# Patient Record
Sex: Female | Born: 1950 | Race: White | Hispanic: No | Marital: Married | Smoking: Former smoker
Health system: Southern US, Community
[De-identification: ages and names within clinical notes are randomized; demographics above are authoritative.]

## PROBLEM LIST (undated history)

## (undated) DIAGNOSIS — I10 Essential (primary) hypertension: Secondary | ICD-10-CM

## (undated) DIAGNOSIS — Z789 Other specified health status: Secondary | ICD-10-CM

## (undated) HISTORY — PX: ABDOMINAL HYSTERECTOMY: SUR658

---

## 2004-04-04 ENCOUNTER — Ambulatory Visit: Payer: Self-pay | Admitting: Gastroenterology

## 2008-08-09 ENCOUNTER — Ambulatory Visit: Payer: Self-pay | Admitting: Obstetrics and Gynecology

## 2009-06-12 ENCOUNTER — Ambulatory Visit: Payer: Self-pay | Admitting: Gastroenterology

## 2013-10-18 ENCOUNTER — Ambulatory Visit: Payer: Self-pay | Admitting: Urology

## 2013-10-18 LAB — CREATININE, SERUM: Creatinine: 0.76 mg/dL (ref 0.60–1.30)

## 2015-01-11 ENCOUNTER — Telehealth: Payer: Self-pay | Admitting: *Deleted

## 2015-01-11 ENCOUNTER — Telehealth: Payer: Self-pay

## 2015-01-11 NOTE — Telephone Encounter (Signed)
[employer] called for prior authorization for [facility] [phone] (REF# [id number])

## 2015-07-16 NOTE — Telephone Encounter (Signed)
Encounter created in error

## 2015-12-06 ENCOUNTER — Ambulatory Visit
Admission: EM | Admit: 2015-12-06 | Discharge: 2015-12-06 | Disposition: A | Discharge status: Home / Self Care | Payer: Medicare Other | Attending: Family Medicine | Admitting: Family Medicine

## 2015-12-06 ENCOUNTER — Ambulatory Visit: Payer: Medicare Other

## 2015-12-06 DIAGNOSIS — Z87891 Personal history of nicotine dependence: Secondary | ICD-10-CM | POA: Diagnosis not present

## 2015-12-06 DIAGNOSIS — M25551 Pain in right hip: Secondary | ICD-10-CM | POA: Diagnosis not present

## 2015-12-06 DIAGNOSIS — M1611 Unilateral primary osteoarthritis, right hip: Secondary | ICD-10-CM | POA: Insufficient documentation

## 2015-12-06 DIAGNOSIS — M5431 Sciatica, right side: Secondary | ICD-10-CM | POA: Insufficient documentation

## 2015-12-06 HISTORY — DX: Other specified health status: Z78.9

## 2015-12-06 MED ORDER — ORPHENADRINE CITRATE ER 100 MG PO TB12: 100.0000 mg | ORAL_TABLET | Freq: Two times a day (BID) | ORAL | Status: DC

## 2015-12-06 MED ORDER — PREDNISONE 10 MG (21) PO TBPK: ORAL_TABLET | ORAL | Status: DC

## 2015-12-06 MED ORDER — MELOXICAM 15 MG PO TABS: 15.0000 mg | ORAL_TABLET | Freq: Every day | ORAL | Status: DC

## 2015-12-06 NOTE — ED Provider Notes (Signed)
CSN: [id number]     Arrival date & time 12/06/15  1209 History   First MD Initiated Contact with Patient 12/06/15 1246    Nurses notes were reviewed. Chief Complaint  Patient presents with  . Sciatica    Pt with right buttock/hip/leg pain x 8 weeks. Diagnosed by chiropractor with sciatica and receiving treatments 3x/weekly. Wants something for the pain stronger than Tylenol or Ibuprofen. Pain 2/10 currently but sometimes gets as high as 8/10    Patient reports started having back pain about 3-4 months ago while she was on a cruise and she was doing some extra walking. For the last 8 weeks is gotten worse and so last week she came to a chiropractor but she had 3 treatments last week and 2 treatments this week. Cousin mouth pain and discomfort she was sent to the urgent care for second evaluation and x-rays. She reports no history of previous back pain she does have a history of having some arthritis in her hip. She states that she's taken some over-the-counter Aleve but it has not helped the pain. She denies any nausea no direct trauma to the hip. She states the pain comes and goes some days is worse than others the last few nights she's not been able to rest getting sleep well.  She states is pretty healthy. Longevity of life with the mother and grandmother. No pertinent family medical history relating to today's visit. No known drug allergies and she stopped smoking over 17 years ago. Only surgeries of his hysterectomy.     (Consider location/radiation/quality/duration/timing/severity/associated sxs/prior Treatment) Patient is a 65 y.o. female presenting with back pain. The history is provided by the patient. No language interpreter was used.  Back Pain Location:  Sacro-iliac joint Quality:  Aching and stabbing Radiates to:  R posterior upper leg and R thigh Pain severity:  Moderate Timing:  Constant Progression:  Resolved Relieved by:  Nothing Worsened by:  NSAIDs Associated symptoms:  leg pain and numbness     Past Medical History  Diagnosis Date  . Patient denies medical problems    Past Surgical History  Procedure Laterality Date  . Abdominal hysterectomy     History reviewed. No pertinent family history. Social History  Substance Use Topics  . Smoking status: Former Games developer  . Smokeless tobacco: None  . Alcohol Use: Yes     Comment: few times per week   OB History    No data available     Review of Systems  Musculoskeletal: Positive for back pain.  Neurological: Positive for numbness.    Allergies  Review of patient's allergies indicates no known allergies.  Home Medications   Prior to Admission medications   Medication Sig Start Date End Date Taking? Authorizing Provider  meloxicam (MOBIC) 15 MG tablet Take 1 tablet (15 mg total) by mouth daily. 12/06/15   Hassan Rowan, MD  orphenadrine (NORFLEX) 100 MG tablet Take 1 tablet (100 mg total) by mouth 2 (two) times daily. 12/06/15   Hassan Rowan, MD  predniSONE (STERAPRED UNI-PAK 21 TAB) 10 MG (21) TBPK tablet Sig 6 tablet day 1, 5 tablets day 2, 4 tablets day 3,,3tablets day 4, 2 tablets day 5, 1 tablet day 6 take all tablets orally 12/06/15   Hassan Rowan, MD   Meds Ordered and Administered this Visit  Medications - No data to display  BP 194/88 mmHg  Pulse 67  Temp(Src) 98.1 F (36.7 C) (Oral)  Resp 16  Ht 5' 3.75" (1.619 m)  Wt 163 lb (73.936 kg)  BMI 28.21 kg/m2  SpO2 98% No data found.   Physical Exam  Constitutional: She is oriented to person, place, and time. She appears well-developed and well-nourished.  HENT:  Head: Normocephalic and atraumatic.  Eyes: Pupils are equal, round, and reactive to light.  Neck: Normal range of motion. Neck supple.  Musculoskeletal: Normal range of motion. She exhibits tenderness.       Right shoulder: She exhibits normal range of motion.       Lumbar back: She exhibits tenderness, pain and spasm. She exhibits normal range of motion and no swelling.        Back:  Patient with pain over the right iliac sacral area and over the right hip but not that much tenderness over the lumbar spine. Patient is states the pain is about a 6/10  Neurological: She is alert and oriented to person, place, and time.  Skin: Skin is warm and dry.  Psychiatric: She has a normal mood and affect.  Vitals reviewed.   ED Course  Procedures (including critical care time)  Labs Review Labs Reviewed - No data to display  Imaging Review Dg Si Joints  12/06/2015  CLINICAL DATA:  Right hip and low back pain for 2 months, no injury EXAM: BILATERAL SACROILIAC JOINTS - 3+ VIEW COMPARISON:  CT abdomen pelvis of 10/18/2013 FINDINGS: The SI joints are well corticated. There is no evidence of sacroiliitis. The both hips are normal position. The pelvic rami are intact. The sacral foramina appear well corticated. IMPRESSION: Negative. Electronically Signed   By: Dwyane DeePaul  Barry M.D.   On: 12/06/2015 14:12   Dg Hip Unilat With Pelvis 2-3 Views Right  12/06/2015  CLINICAL DATA:  Right hip and low back pain for 2 months, no injury EXAM: DG HIP (WITH OR WITHOUT PELVIS) 2-3V RIGHT COMPARISON:  The hips are normal position. Very minimal degenerative joint disease of the hips is present with minimal bony spurring. The pelvic rami are intact. The SI joints are corticated. No acute abnormality is seen. IMPRESSION: Very minimal degenerative joint disease of the hips for age. No acute abnormality. Electronically Signed   By: Dwyane DeePaul  Barry M.D.   On: 12/06/2015 14:14     Visual Acuity Review  Right Eye Distance:   Left Eye Distance:   Bilateral Distance:    Right Eye Near:   Left Eye Near:    Bilateral Near:         MDM   1. Back pain with right-sided sciatica   2. Hip pain, acute, right   3. Osteoarthritis of right hip, unspecified osteoarthritis type    Patient informed results for x-rays will place on prednisone, Mobic 15 mg and Norflex 100 mg twice a day. Follow-up with Dr. Duwayne HeckIsaiah  next week. .  Note: This dictation was prepared with Dragon dictation along with smaller phrase technology. Any transcriptional errors that result from this process are unintentional.    Hassan RowanEugene Quiara Killian, MD 12/06/15 1445

## 2015-12-06 NOTE — Discharge Instructions (Signed)
Cryotherapy Cryotherapy is when you put ice on your injury. Ice helps lessen pain and puffiness (swelling) after an injury. Ice works the best when you start using it in the first 24 to 48 hours after an injury. HOME CARE  Put a dry or damp towel between the ice pack and your skin.  You may press gently on the ice pack.  Leave the ice on for no more than 10 to 20 minutes at a time.  Check your skin after 5 minutes to make sure your skin is okay.  Rest at least 20 minutes between ice pack uses.  Stop using ice when your skin loses feeling (numbness).  Do not use ice on someone who cannot tell you when it hurts. This includes small children and people with memory problems (dementia). GET HELP RIGHT AWAY IF:  You have white spots on your skin.  Your skin turns blue or pale.  Your skin feels waxy or hard.  Your puffiness gets worse. MAKE SURE YOU:   Understand these instructions.  Will watch your condition.  Will get help right away if you are not doing well or get worse.   This information is not intended to replace advice given to you by your health care provider. Make sure you discuss any questions you have with your health care provider.   Document Released: 12/03/2007 Document Revised: 09/08/2011 Document Reviewed: 02/06/2011 Elsevier Interactive Patient Education 2016 [employer].  Joint Pain Joint pain can be caused by many things. The joint can be bruised, infected, weak from aging, or sore from exercise. The pain will probably go away if you follow your doctor's instructions for home care. If your joint pain continues, more tests may be needed to help find the cause of your condition. HOME CARE Watch your condition for any changes. Follow these instructions as told to lessen the pain that you are feeling:  Take medicines only as told by your doctor.  Rest the sore joint for as long as told by your doctor. If your doctor tells you to, raise (elevate) the painful  joint above the level of your heart while you are sitting or lying down.  Do not do things that cause pain or make the pain worse.  If told, put ice on the painful area:  Put ice in a plastic bag.  Place a towel between your skin and the bag.  Leave the ice on for 20 minutes, 2-3 times per day.  Wear an elastic bandage, splint, or sling as told by your doctor. Loosen the bandage or splint if your fingers or toes lose feeling (become numb) and tingle, or if they turn cold and blue.  Begin exercising or stretching the joint as told by your doctor. Ask your doctor what types of exercise are safe for you.  Keep all follow-up visits as told by your doctor. This is important. GET HELP IF:  Your pain gets worse and medicine does not help it.  Your joint pain does not get better in 3 days.  You have more bruising or swelling.  You have a fever.  You lose 10 pounds (4.5 kg) or more without trying. GET HELP RIGHT AWAY IF:  You are not able to move the joint.  Your fingers or toes become numb or they turn cold and blue.   This information is not intended to replace advice given to you by your health care provider. Make sure you discuss any questions you have with your health care  provider.   Document Released: 06/04/2009 Document Revised: 07/07/2014 Document Reviewed: 03/28/2014 Elsevier Interactive Patient Education 2016 [employer]  Hip Pain Your hip is the joint between your upper legs and your lower pelvis. The bones, cartilage, tendons, and muscles of your hip joint perform a lot of work each day supporting your body weight and allowing you to move around. Hip pain can range from a minor ache to severe pain in one or both of your hips. Pain may be felt on the inside of the hip joint near the groin, or the outside near the buttocks and upper thigh. You may have swelling or stiffness as well.  HOME CARE INSTRUCTIONS   Take medicines only as directed by your health care  provider.  Apply ice to the injured area:  Put ice in a plastic bag.  Place a towel between your skin and the bag.  Leave the ice on for 15-20 minutes at a time, 3-4 times a day.  Keep your leg raised (elevated) when possible to lessen swelling.  Avoid activities that cause pain.  Follow specific exercises as directed by your health care provider.  Sleep with a pillow between your legs on your most comfortable side.  Record how often you have hip pain, the location of the pain, and what it feels like. SEEK MEDICAL CARE IF:   You are unable to put weight on your leg.  Your hip is red or swollen or very tender to touch.  Your pain or swelling continues or worsens after 1 week.  You have increasing difficulty walking.  You have a fever. SEEK IMMEDIATE MEDICAL CARE IF:   You have fallen.  You have a sudden increase in pain and swelling in your hip. MAKE SURE YOU:   Understand these instructions.  Will watch your condition.  Will get help right away if you are not doing well or get worse.   This information is not intended to replace advice given to you by your health care provider. Make sure you discuss any questions you have with your health care provider.   Document Released: 12/04/2009 Document Revised: 07/07/2014 Document Reviewed: 02/10/2013 Elsevier Interactive Patient Education 2016 Else[employer] Inc.  Sciatica Sciatica is pain, weakness, numbness, or tingling along your sciatic nerve. The nerve starts in the lower back and runs down the back of each leg. Nerve damage or certain conditions pinch or put pressure on the sciatic nerve. This causes the pain, weakness, and other discomforts of sciatica. HOME CARE   Only take medicine as told by your doctor.  Apply ice to the affected area for 20 minutes. Do this 3-4 times a day for the first 48-72 hours. Then try heat in the same way.  Exercise, stretch, or do your usual activities if these do not make your pain  worse.  Go to physical therapy as told by your doctor.  Keep all doctor visits as told.  Do not wear high heels or shoes that are not supportive.  Get a firm mattress if your mattress is too soft to lessen pain and discomfort. GET HELP RIGHT AWAY IF:   You cannot control when you poop (bowel movement) or pee (urinate).  You have more weakness in your lower back, lower belly (pelvis), butt (buttocks), or legs.  You have redness or puffiness (swelling) of your back.  You have a burning feeling when you pee.  You have pain that gets worse when you lie down.  You have pain that wakes you from  your sleep.  Your pain is worse than past pain.  Your pain lasts longer than 4 weeks.  You are suddenly losing weight without reason. MAKE SURE YOU:   Understand these instructions.  Will watch this condition.  Will get help right away if you are not doing well or get worse.   This information is not intended to replace advice given to you by your health care provider. Make sure you discuss any questions you have with your health care provider.   Document Released: 03/25/2008 Document Revised: 03/07/2015 Document Reviewed: 10/26/2011 Elsevier Interactive Patient Education [employer]

## 2019-02-08 ENCOUNTER — Other Ambulatory Visit: Payer: Self-pay

## 2019-02-08 DIAGNOSIS — E876 Hypokalemia: Secondary | ICD-10-CM | POA: Insufficient documentation

## 2019-02-08 DIAGNOSIS — Z87891 Personal history of nicotine dependence: Secondary | ICD-10-CM | POA: Diagnosis not present

## 2019-02-08 DIAGNOSIS — Z79899 Other long term (current) drug therapy: Secondary | ICD-10-CM | POA: Insufficient documentation

## 2019-02-08 DIAGNOSIS — I160 Hypertensive urgency: Principal | ICD-10-CM | POA: Insufficient documentation

## 2019-02-08 DIAGNOSIS — Z8249 Family history of ischemic heart disease and other diseases of the circulatory system: Secondary | ICD-10-CM | POA: Insufficient documentation

## 2019-02-08 DIAGNOSIS — I248 Other forms of acute ischemic heart disease: Secondary | ICD-10-CM | POA: Diagnosis not present

## 2019-02-08 DIAGNOSIS — F419 Anxiety disorder, unspecified: Secondary | ICD-10-CM | POA: Insufficient documentation

## 2019-02-08 DIAGNOSIS — I10 Essential (primary) hypertension: Secondary | ICD-10-CM | POA: Insufficient documentation

## 2019-02-08 DIAGNOSIS — R7989 Other specified abnormal findings of blood chemistry: Secondary | ICD-10-CM | POA: Diagnosis present

## 2019-02-08 DIAGNOSIS — R079 Chest pain, unspecified: Secondary | ICD-10-CM | POA: Diagnosis present

## 2019-02-08 DIAGNOSIS — Z733 Stress, not elsewhere classified: Secondary | ICD-10-CM | POA: Diagnosis not present

## 2019-02-08 DIAGNOSIS — Z20828 Contact with and (suspected) exposure to other viral communicable diseases: Secondary | ICD-10-CM | POA: Insufficient documentation

## 2019-02-09 ENCOUNTER — Observation Stay
Admission: EM | Admit: 2019-02-09 | Discharge: 2019-02-09 | Disposition: A | Discharge status: Home / Self Care | Payer: Medicare Other | Attending: Specialist | Admitting: Specialist

## 2019-02-09 ENCOUNTER — Emergency Department: Payer: Medicare Other

## 2019-02-09 ENCOUNTER — Encounter: Payer: Self-pay | Admitting: Emergency Medicine

## 2019-02-09 DIAGNOSIS — I160 Hypertensive urgency: Secondary | ICD-10-CM | POA: Diagnosis present

## 2019-02-09 DIAGNOSIS — R778 Other specified abnormalities of plasma proteins: Secondary | ICD-10-CM

## 2019-02-09 DIAGNOSIS — R079 Chest pain, unspecified: Secondary | ICD-10-CM

## 2019-02-09 DIAGNOSIS — I10 Essential (primary) hypertension: Secondary | ICD-10-CM

## 2019-02-09 HISTORY — DX: Essential (primary) hypertension: I10

## 2019-02-09 LAB — COMPREHENSIVE METABOLIC PANEL
ALT: 21 U/L (ref 0–44)
AST: 24 U/L (ref 15–41)
Albumin: 4.3 g/dL (ref 3.5–5.0)
Alkaline Phosphatase: 84 U/L (ref 38–126)
Anion gap: 7 (ref 5–15)
BUN: 12 mg/dL (ref 8–23)
CO2: 27 mmol/L (ref 22–32)
Calcium: 9.1 mg/dL (ref 8.9–10.3)
Chloride: 105 mmol/L (ref 98–111)
Creatinine, Ser: 0.6 mg/dL (ref 0.44–1.00)
GFR calc Af Amer: >60 mL/min (ref >60)
GFR calc non Af Amer: >60 mL/min (ref >60)
Glucose, Bld: 134 mg/dL — ABNORMAL HIGH (ref 70–99)
Potassium: 3.4 mmol/L — ABNORMAL LOW (ref 3.5–5.1)
Sodium: 139 mmol/L (ref 135–145)
Total Bilirubin: 0.3 mg/dL (ref 0.3–1.2)
Total Protein: 7.1 g/dL (ref 6.5–8.1)

## 2019-02-09 LAB — CBC WITH DIFFERENTIAL/PLATELET
Abs Immature Granulocytes: 0.03 10*3/uL (ref 0.00–0.07)
Basophils Absolute: 0 10*3/uL (ref 0.0–0.1)
Basophils Relative: 0 %
Eosinophils Absolute: 0.3 10*3/uL (ref 0.0–0.5)
Eosinophils Relative: 3 %
HCT: 41.3 % (ref 36.0–46.0)
Hemoglobin: 13.8 g/dL (ref 12.0–15.0)
Immature Granulocytes: 0 %
Lymphocytes Relative: 34 %
Lymphs Abs: 2.6 10*3/uL (ref 0.7–4.0)
MCH: 30.7 pg (ref 26.0–34.0)
MCHC: 33.4 g/dL (ref 30.0–36.0)
MCV: 91.8 fL (ref 80.0–100.0)
Monocytes Absolute: 0.5 10*3/uL (ref 0.1–1.0)
Monocytes Relative: 7 %
Neutro Abs: 4.3 10*3/uL (ref 1.7–7.7)
Neutrophils Relative %: 56 %
Platelets: 240 10*3/uL (ref 150–400)
RBC: 4.5 MIL/uL (ref 3.87–5.11)
RDW: 12.5 % (ref 11.5–15.5)
WBC: 7.7 10*3/uL (ref 4.0–10.5)
nRBC: 0 % (ref 0.0–0.2)

## 2019-02-09 LAB — TSH: TSH: 2.936 u[IU]/mL (ref 0.350–4.500)

## 2019-02-09 LAB — URINALYSIS, COMPLETE (UACMP) WITH MICROSCOPIC
Bacteria, UA: NONE SEEN
Bilirubin Urine: NEGATIVE
Glucose, UA: NEGATIVE mg/dL
Ketones, ur: NEGATIVE mg/dL
Nitrite: NEGATIVE
Protein, ur: NEGATIVE mg/dL
Specific Gravity, Urine: 1.011 (ref 1.005–1.030)
pH: 5 (ref 5.0–8.0)

## 2019-02-09 LAB — SARS CORONAVIRUS 2 BY RT PCR (HOSPITAL ORDER, PERFORMED IN ~~LOC~~ HOSPITAL LAB): SARS Coronavirus 2: NEGATIVE

## 2019-02-09 LAB — TROPONIN I (HIGH SENSITIVITY)
Troponin I (High Sensitivity): 18 ng/L — ABNORMAL HIGH (ref <18)
Troponin I (High Sensitivity): 20 ng/L — ABNORMAL HIGH (ref <18)
Troponin I (High Sensitivity): 22 ng/L — ABNORMAL HIGH (ref <18)
Troponin I (High Sensitivity): 7 ng/L (ref <18)

## 2019-02-09 MED ORDER — ACETAMINOPHEN 325 MG PO TABS: 650.0000 mg | ORAL_TABLET | Freq: Four times a day (QID) | ORAL | Status: DC | PRN

## 2019-02-09 MED ORDER — ACETAMINOPHEN 650 MG RE SUPP: 650.0000 mg | Freq: Four times a day (QID) | RECTAL | Status: DC | PRN

## 2019-02-09 MED ORDER — POTASSIUM CHLORIDE CRYS ER 20 MEQ PO TBCR
20.0000 meq | EXTENDED_RELEASE_TABLET | Freq: Once | ORAL | Status: AC
  Administered 2019-02-09: 14:00:00 20 meq via ORAL
  Filled 2019-02-09: qty 1

## 2019-02-09 MED ORDER — ONDANSETRON HCL 4 MG/2ML IJ SOLN: 4.0000 mg | Freq: Four times a day (QID) | INTRAMUSCULAR | Status: DC | PRN

## 2019-02-09 MED ORDER — HYDRALAZINE HCL 20 MG/ML IJ SOLN: 10.0000 mg | Freq: Four times a day (QID) | INTRAMUSCULAR | Status: DC | PRN

## 2019-02-09 MED ORDER — AMLODIPINE BESYLATE 5 MG PO TABS
5.0000 mg | ORAL_TABLET | Freq: Every day | ORAL | Status: DC
  Administered 2019-02-09: 5 mg via ORAL
  Filled 2019-02-09: qty 1

## 2019-02-09 MED ORDER — CLONIDINE HCL 0.1 MG PO TABS
0.0500 mg | ORAL_TABLET | Freq: Once | ORAL | Status: AC
  Administered 2019-02-09: 04:00:00 0.05 mg via ORAL

## 2019-02-09 MED ORDER — CLONIDINE HCL 0.1 MG PO TABS
0.1000 mg | ORAL_TABLET | Freq: Once | ORAL | Status: DC
  Filled 2019-02-09: qty 1

## 2019-02-09 MED ORDER — AMLODIPINE BESYLATE 10 MG PO TABS: 10.0000 mg | ORAL_TABLET | Freq: Every day | ORAL | Status: DC

## 2019-02-09 MED ORDER — LISINOPRIL 20 MG PO TABS
20.0000 mg | ORAL_TABLET | Freq: Every day | ORAL | Status: DC
  Administered 2019-02-09: 20 mg via ORAL
  Filled 2019-02-09: qty 1

## 2019-02-09 MED ORDER — AMLODIPINE BESYLATE 5 MG PO TABS: 5.0000 mg | ORAL_TABLET | Freq: Every day | ORAL | 0 refills | Status: AC

## 2019-02-09 MED ORDER — LISINOPRIL 20 MG PO TABS: 20.0000 mg | ORAL_TABLET | Freq: Every day | ORAL | 0 refills | Status: AC

## 2019-02-09 MED ORDER — LISINOPRIL 10 MG PO TABS: 10.0000 mg | ORAL_TABLET | Freq: Every day | ORAL | Status: DC

## 2019-02-09 MED ORDER — ONDANSETRON HCL 4 MG PO TABS: 4.0000 mg | ORAL_TABLET | Freq: Four times a day (QID) | ORAL | Status: DC | PRN

## 2019-02-09 MED ORDER — ENOXAPARIN SODIUM 40 MG/0.4ML ~~LOC~~ SOLN: 40.0000 mg | SUBCUTANEOUS | Status: DC

## 2019-02-09 MED ORDER — DOCUSATE SODIUM 100 MG PO CAPS
100.0000 mg | ORAL_CAPSULE | Freq: Two times a day (BID) | ORAL | Status: DC
  Administered 2019-02-09: 100 mg via ORAL
  Filled 2019-02-09: qty 1

## 2019-02-09 NOTE — ED Notes (Signed)
ED TO INPATIENT HANDOFF REPORT  ED Nurse Name and Phone #: gracie   S Name/Age/Gender Rhonda Rogers 68 y.o. female Room/Bed: ED17A/ED17A  Code Status   Code Status: Not on file  Home/SNF/Other Home Patient oriented to: self, place, time and situation Is this baseline? Yes   Triage Complete: Triage complete  Chief Complaint chest pain   Triage Note Pt presents to ED with HTN for the past couple of days with no improvement with her prescribed medications. Pt reports that today she developed chest tightness while driving to ED. Pt states she is not sure if that may have been caused by her anxiety due to her blood pressure being elevated. Chest tightness has resolved at this time. Denies sob.    Allergies No Known Allergies  Level of Care/Admitting Diagnosis ED Disposition    ED Disposition Condition Comment   Admit  Hospital Area: [facility] [[id number]]  Level of Care: Telemetry [5]  Covid Evaluation: Asymptomatic Screening Protocol (No Symptoms)  Diagnosis: Hypertensive urgency [161096][[id number]]  Admitting Physician: Arnaldo NatalDIAMOND, MICHAEL S [[id number]]  Attending Physician: Arnaldo NatalIAMOND, MICHAEL S [phone][[id number]]  PT Class (Do Not Modify): Observation [104]  PT Acc Code (Do Not Modify): Observation [10022]       B Medical/Surgery History Past Medical History:  Diagnosis Date  . Hypertension   . Patient denies medical problems    Past Surgical History:  Procedure Laterality Date  . ABDOMINAL HYSTERECTOMY       A IV Location/Drains/Wounds Patient Lines/Drains/Airways Status   Active Line/Drains/Airways    Name:   Placement date:   Placement time:   Site:   Days:   Peripheral IV 02/09/19 Left Antecubital   02/09/19    0342    Antecubital   less than 1          Intake/Output Last 24 hours No intake or output data in the 24 hours ending 02/09/19 [id number]  Labs/Imaging Results for orders placed or performed during the hospital encounter of 02/09/19 (from the past 48  hour(s))  CBC with Differential     Status: None   Collection Time: 02/09/19 12:13 AM  Result Value Ref Range   WBC 7.7 4.0 - 10.5 K/uL   RBC 4.50 3.87 - 5.11 MIL/uL   Hemoglobin 13.8 12.0 - 15.0 g/dL   HCT 21.341.3 08.636.0 - 57.846.0 %   MCV 91.8 80.0 - 100.0 fL   MCH 30.7 26.0 - 34.0 pg   MCHC 33.4 30.0 - 36.0 g/dL   RDW 46.912.5 62.911.5 - 52.815.5 %   Platelets 240 150 - 400 K/uL   nRBC 0.0 0.0 - 0.2 %   Neutrophils Relative % 56 %   Neutro Abs 4.3 1.7 - 7.7 K/uL   Lymphocytes Relative 34 %   Lymphs Abs 2.6 0.7 - 4.0 K/uL   Monocytes Relative 7 %   Monocytes Absolute 0.5 0.1 - 1.0 K/uL   Eosinophils Relative 3 %   Eosinophils Absolute 0.3 0.0 - 0.5 K/uL   Basophils Relative 0 %   Basophils Absolute 0.0 0.0 - 0.1 K/uL   Immature Granulocytes 0 %   Abs Immature Granulocytes 0.03 0.00 - 0.07 K/uL    Comment: Performed at [facility], [address] [place], [place] [address]  Comprehensive metabolic panel     Status: Abnormal   Collection Time: 02/09/19 12:13 AM  Result Value Ref Range   Sodium 139 135 - 145 mmol/L   Potassium 3.4 (L) 3.5 - 5.1 mmol/L  Chloride 105 98 - 111 mmol/L   CO2 27 22 - 32 mmol/L   Glucose, Bld 134 (H) 70 - 99 mg/dL   BUN 12 8 - 23 mg/dL   Creatinine, Ser 1.610.60 0.44 - 1.00 mg/dL   Calcium 9.1 8.9 - 09.610.3 mg/dL   Total Protein 7.1 6.5 - 8.1 g/dL   Albumin 4.3 3.5 - 5.0 g/dL   AST 24 15 - 41 U/L   ALT 21 0 - 44 U/L   Alkaline Phosphatase 84 38 - 126 U/L   Total Bilirubin 0.3 0.3 - 1.2 mg/dL   GFR calc non Af Amer >60 >60 mL/min   GFR calc Af Amer >60 >60 mL/min   Anion gap 7 5 - 15    Comment: Performed at [facility], [address] [place], [place] [address]  Troponin I (High Sensitivity)     Status: None   Collection Time: 02/09/19 12:13 AM  Result Value Ref Range   Troponin I (High Sensitivity) 7 <18 ng/L    Comment: (NOTE) Elevated high sensitivity troponin I (hsTnI) values and significant  changes across serial measurements may  suggest ACS but many other  chronic and acute conditions are known to elevate hsTnI results.  Refer to the "Links" section for chest pain algorithms and additional  guidance. Performed at [facility], [address] [place], [place] [address]   Troponin I (High Sensitivity)     Status: Abnormal   Collection Time: 02/09/19  3:20 AM  Result Value Ref Range   Troponin I (High Sensitivity) 18 (H) <18 ng/L    Comment: (NOTE) Elevated high sensitivity troponin I (hsTnI) values and significant  changes across serial measurements may suggest ACS but many other  chronic and acute conditions are known to elevate hsTnI results.  Refer to the "Links" section for chest pain algorithms and additional  guidance. Performed at [facility], [address] [place], [place] [address]   SARS Coronavirus 2 [facility](Hospital order, Performed in [facility] hospital lab) Nasopharyngeal Nasopharyngeal Swab     Status: None   Collection Time: 02/09/19  5:08 AM   Specimen: Nasopharyngeal Swab  Result Value Ref Range   SARS Coronavirus 2 NEGATIVE NEGATIVE    Comment: (NOTE) If result is NEGATIVE SARS-CoV-2 target nucleic acids are NOT DETECTED. The SARS-CoV-2 RNA is generally detectable in upper and lower  respiratory specimens during the acute phase of infection. The lowest  concentration of SARS-CoV-2 viral copies this assay can detect is 250  copies / mL. A negative result does not preclude SARS-CoV-2 infection  and should not be used as the sole basis for treatment or other  patient management decisions.  A negative result may occur with  improper specimen collection / handling, submission of specimen other  than nasopharyngeal swab, presence of viral mutation(s) within the  areas targeted by this assay, and inadequate number of viral copies  (<250 copies / mL). A negative result must be combined with clinical  observations, patient history, and epidemiological information. If result  is POSITIVE SARS-CoV-2 target nucleic acids are DETECTED. The SARS-CoV-2 RNA is generally detectable in upper and lower  respiratory specimens dur ing the acute phase of infection.  Positive  results are indicative of active infection with SARS-CoV-2.  Clinical  correlation with patient history and other diagnostic information is  necessary to determine patient infection status.  Positive results do  not rule out bacterial infection or co-infection with other viruses. If result is PRESUMPTIVE  POSTIVE SARS-CoV-2 nucleic acids MAY BE PRESENT.   A presumptive positive result was obtained on the submitted specimen  and confirmed on repeat testing.  While 2019 novel coronavirus  (SARS-CoV-2) nucleic acids may be present in the submitted sample  additional confirmatory testing may be necessary for epidemiological  and / or clinical management purposes  to differentiate between  SARS-CoV-2 and other Sarbecovirus currently known to infect humans.  If clinically indicated additional testing with an alternate test  methodology [phone]) is advised. The SARS-CoV-2 RNA is generally  detectable in upper and lower respiratory sp ecimens during the acute  phase of infection. The expected result is Negative. Fact Sheet for Patients:  StrictlyIdeas.no Fact Sheet for Healthcare Providers: BankingDealers.co.za This test is not yet approved or cleared by the [place] FDA and has been authorized for detection and/or diagnosis of SARS-CoV-2 by FDA under an Emergency Use Authorization (EUA).  This EUA will remain in effect (meaning this test can be used) for the duration of the COVID-19 declaration under Section 564(b)(1) of the Act, 21 U.S.C. section 360bbb-3(b)(1), unless the authorization is terminated or revoked sooner. Performed at [facility], [address] [place], [place] [address]   Urinalysis, Complete w Microscopic     Status:  Abnormal   Collection Time: 02/09/19  5:10 AM  Result Value Ref Range   Color, Urine STRAW (A) YELLOW   APPearance HAZY (A) CLEAR   Specific Gravity, Urine 1.011 1.005 - 1.030   pH 5.0 5.0 - 8.0   Glucose, UA NEGATIVE NEGATIVE mg/dL   Hgb urine dipstick SMALL (A) NEGATIVE   Bilirubin Urine NEGATIVE NEGATIVE   Ketones, ur NEGATIVE NEGATIVE mg/dL   Protein, ur NEGATIVE NEGATIVE mg/dL   Nitrite NEGATIVE NEGATIVE   Leukocytes,Ua SMALL (A) NEGATIVE   RBC / HPF 0-5 0 - 5 RBC/hpf   WBC, UA 6-10 0 - 5 WBC/hpf   Bacteria, UA NONE SEEN NONE SEEN   Squamous Epithelial / LPF 0-5 0 - 5   Mucus PRESENT     Comment: Performed at [facility], [place], [place] [address]  Troponin I (High Sensitivity)     Status: Abnormal   Collection Time: 02/09/19  5:10 AM  Result Value Ref Range   Troponin I (High Sensitivity) 20 (H) <18 ng/L    Comment: (NOTE) Elevated high sensitivity troponin I (hsTnI) values and significant  changes across serial measurements may suggest ACS but many other  chronic and acute conditions are known to elevate hsTnI results.  Refer to the "Links" section for chest pain algorithms and additional  guidance. Performed at [facility], [address] [place], [place] [address]    Dg Chest 2 View  Result Date: 02/09/2019 CLINICAL DATA:  Hypertension EXAM: CHEST - 2 VIEW COMPARISON:  None. FINDINGS: Lungs are clear.  No pleural effusion or pneumothorax. The heart is normal in size. Visualized osseous structures are within normal limits. IMPRESSION: Normal chest radiographs. Electronically Signed   By: Julian Hy M.D.   On: 02/09/2019 03:23    Pending Labs Unresulted Labs (From admission, onward)    Start     Ordered   Signed and Held  Creatinine, serum  (enoxaparin (LOVENOX)    CrCl >/= 30 ml/min)  Weekly,   R    Comments: while on enoxaparin therapy    Signed and Held   Signed and Held  TSH  Add-on,   R     Signed and Held  Vitals/Pain Today's Vitals   02/09/19 0345 02/09/19 0400 02/09/19 0415 02/09/19 0430  BP: (!) 163/58 (!) 175/69 (!) 179/61 (!) 162/55  Pulse: (!) 59 (!) 57 61 (!) 57  Resp:      Temp:      TempSrc:      SpO2: 99% 99% 100% 100%  Weight:      Height:      PainSc:        Isolation Precautions No active isolations  Medications Medications  amLODipine (NORVASC) tablet 10 mg (has no administration in time range)  hydrALAZINE (APRESOLINE) injection 10 mg (has no administration in time range)  cloNIDine (CATAPRES) tablet 0.05 mg (0.05 mg Oral Given 02/09/19 0349)    Mobility walks Low fall risk   Focused Assessments Cardiac Assessment Handoff:    No results found for: CKTOTAL, CKMB, CKMBINDEX, TROPONINI No results found for: DDIMER Does the Patient currently have chest pain? No      R Recommendations: See Admitting Provider Note  Report given to:   Additional Notes:

## 2019-02-09 NOTE — Progress Notes (Addendum)
Patient admitted to floor from ED to 2A 236.  Oriented to floor and surroundings. Call bell in reach.

## 2019-02-09 NOTE — ED Provider Notes (Signed)
[facility] Emergency Department Provider Note   ____________________________________________   First MD Initiated Contact with Patient 02/09/19 0259     (approximate)  I have reviewed the triage vital signs and the nursing notes.   HISTORY  Chief Complaint Chest Pain and Hypertension    HPI Rhonda Rogers is a 68 y.o. female who presents to the ED from home with a chief complaint of elevated blood pressure.  Patient was started on lisinopril 5 mg daily approximately 5 weeks ago.  Her PCP just increased it to 5 mg daily last week.  Over the last 2 to 3 days she has been noticing her systolic going up.  Baseline BP 140/70s.  Tonight her systolic was over 762 and she became concerned, repeated it multiple times and decided to come to the ED for evaluation.  In route she had a brief period of chest tightness which has now resolved.  Denies fever, cough, shortness of breath, abdominal pain, nausea, vomiting, dysuria.  Denies recent travel, trauma or exposure to persons diagnosed with coronavirus.       Past Medical History:  Diagnosis Date  . Hypertension   . Patient denies medical problems     Patient Active Problem List   Diagnosis Date Noted  . Hypertensive urgency 02/09/2019    Past Surgical History:  Procedure Laterality Date  . ABDOMINAL HYSTERECTOMY      Prior to Admission medications   Medication Sig Start Date End Date Taking? Authorizing Provider  lisinopril (ZESTRIL) 10 MG tablet Take 10 mg by mouth daily. 01/20/19 01/20/20 Yes [provider]  tretinoin (RETIN-A) 0.025 % cream Apply 1 application topically at bedtime.    Yes [provider]    Allergies Patient has no known allergies.  Family History  Problem Relation Age of Onset  . Hypertension Mother   . CAD Father     Social History Social History   Tobacco Use  . Smoking status: Former Research scientist (life sciences)  . Smokeless tobacco: Never Used  Substance Use Topics  .  Alcohol use: Yes    Comment: few times per week  . Drug use: Never    Review of Systems  Constitutional: Positive for elevated blood pressure.  No fever/chills Eyes: No visual changes. ENT: No sore throat. Cardiovascular: Positive for chest tightness. Respiratory: Denies shortness of breath. Gastrointestinal: No abdominal pain.  No nausea, no vomiting.  No diarrhea.  No constipation. Genitourinary: Negative for dysuria. Musculoskeletal: Negative for back pain. Skin: Negative for rash. Neurological: Negative for headaches, focal weakness or numbness.   ____________________________________________   PHYSICAL EXAM:  VITAL SIGNS: ED Triage Vitals  Enc Vitals Group     BP 02/08/19 2356 (!) 257/79     Pulse Rate 02/08/19 2356 79     Resp 02/08/19 2356 20     Temp 02/08/19 2356 98.2 F (36.8 C)     Temp Source 02/08/19 2356 Oral     SpO2 02/08/19 2356 97 %     Weight 02/09/19 0007 170 lb (77.1 kg)     Height 02/09/19 0007 5\' 4"  (1.626 m)     Head Circumference --      Peak Flow --      Pain Score 02/09/19 0007 0     Pain Loc --      Pain Edu? --      Excl. in [place]? --     Constitutional: Alert and oriented. Well appearing and in no acute distress. Eyes: Conjunctivae are  normal. PERRL. EOMI. Head: Atraumatic. Nose: No congestion/rhinnorhea. Mouth/Throat: Mucous membranes are moist.  Oropharynx non-erythematous. Neck: No stridor.  No carotid bruits. Cardiovascular: Normal rate, regular rhythm. Grossly normal heart sounds.  Good peripheral circulation. Respiratory: Normal respiratory effort.  No retractions. Lungs CTAB. Gastrointestinal: Soft and nontender. No distention. No abdominal bruits. No CVA tenderness. Musculoskeletal: No lower extremity tenderness nor edema.  No joint effusions. Neurologic:  Normal speech and language. No gross focal neurologic deficits are appreciated. No gait instability. Skin:  Skin is warm, dry and intact. No rash noted. Psychiatric: Mood and  affect are slightly anxious. Speech and behavior are normal.  ____________________________________________   LABS (all labs ordered are listed, but only abnormal results are displayed)  Labs Reviewed  COMPREHENSIVE METABOLIC PANEL - Abnormal; Notable for the following components:      Result Value   Potassium 3.4 (*)    Glucose, Bld 134 (*)    All other components within normal limits  URINALYSIS, COMPLETE (UACMP) WITH MICROSCOPIC - Abnormal; Notable for the following components:   Color, Urine STRAW (*)    APPearance HAZY (*)    Hgb urine dipstick SMALL (*)    Leukocytes,Ua SMALL (*)    All other components within normal limits  TROPONIN I (HIGH SENSITIVITY) - Abnormal; Notable for the following components:   Troponin I (High Sensitivity) 18 (*)    All other components within normal limits  TROPONIN I (HIGH SENSITIVITY) - Abnormal; Notable for the following components:   Troponin I (High Sensitivity) 20 (*)    All other components within normal limits  SARS CORONAVIRUS 2 (HOSPITAL ORDER, PERFORMED IN [facility])  CBC WITH DIFFERENTIAL/PLATELET  TROPONIN I (HIGH SENSITIVITY)  TROPONIN I (HIGH SENSITIVITY)   ____________________________________________  EKG  ED ECG REPORT I, Jerric Oyen J, the attending physician, personally viewed and interpreted this ECG.   Date: 02/09/2019  EKG Time: 0008  Rate: 71  Rhythm: normal EKG, normal sinus rhythm  Axis: Normal  Intervals:none  ST&T Change: Nonspecific  ____________________________________________  RADIOLOGY  ED MD interpretation: No acute cardiopulmonary process  Official radiology report(s): Dg Chest 2 View  Result Date: 02/09/2019 CLINICAL DATA:  Hypertension EXAM: CHEST - 2 VIEW COMPARISON:  None. FINDINGS: Lungs are clear.  No pleural effusion or pneumothorax. The heart is normal in size. Visualized osseous structures are within normal limits. IMPRESSION: Normal chest radiographs. Electronically Signed    By: Charline BillsSriyesh  Krishnan M.D.   On: 02/09/2019 03:23    ____________________________________________   PROCEDURES  Procedure(s) performed (including Critical Care):  Procedures   ____________________________________________   INITIAL IMPRESSION / ASSESSMENT AND PLAN / ED COURSE  As part of my medical decision making, I reviewed the following data within the electronic MEDICAL RECORD NUMBER Nursing notes reviewed and incorporated, Labs reviewed, EKG interpreted, Old chart reviewed, Radiograph reviewed and Notes from prior ED visits     Liberty HandyLinda C Anwar was evaluated in Emergency Department on 02/09/2019 for the symptoms described in the history of present illness. She was evaluated in the context of the global COVID-19 pandemic, which necessitated consideration that the patient might be at risk for infection with the SARS-CoV-2 virus that causes COVID-19. Institutional protocols and algorithms that pertain to the evaluation of patients at risk for COVID-19 are in a state of rapid change based on information released by regulatory bodies including the CDC and federal and state organizations. These policies and algorithms were followed during the patient's care in the ED.   68 year old female  who recently started antihypertensive presenting with elevated blood pressure and brief episode of chest tightness. Differential diagnosis includes, but is not limited to, ACS, aortic dissection, pulmonary embolism, cardiac tamponade, pneumothorax, pneumonia, pericarditis, myocarditis, GI-related causes including esophagitis/gastritis, and musculoskeletal chest wall pain.    Initial EKG and troponin unremarkable.  Will repeat timed troponin, check chest x-ray, urinalysis.  Blood pressure is steadily improving on its own; will give just one half clonidine tablet and reassess.   Clinical Course as of Feb 08 642  Wed Feb 09, 2019  [id number] Updated patient and spouse of repeat troponin which has more than doubled from  prior.  Will administer aspirin and discuss with hospitalist Dr. Sheryle Hailiamond to evaluate patient in emergency department for admission.   [JS]    Clinical Course User Index [JS] Irean HongSung, Alamin Mccuiston J, MD     ____________________________________________   FINAL CLINICAL IMPRESSION(S) / ED DIAGNOSES  Final diagnoses:  Essential hypertension  Chest pain, unspecified type  Elevated troponin     ED Discharge Orders    None       Note:  This document was prepared using Dragon voice recognition software and may include unintentional dictation errors.   Irean HongSung, Anzleigh Slaven J, MD 02/09/19 [id number]

## 2019-02-09 NOTE — Discharge Summary (Signed)
[employer] [facility] [place] [facility]   PATIENT NAME: Rhonda MaduraLinda Rogers    MR#:  [id number]  DATE OF BIRTH:  1950-10-21  DATE OF ADMISSION:  02/09/2019 ADMITTING PHYSICIAN: Arnaldo NatalMichael S Diamond, MD  DATE OF DISCHARGE: 02/09/2019  PRIMARY CARE PHYSICIAN: Marina GoodellFeldpausch, Dale E, MD    ADMISSION DIAGNOSIS:  Elevated troponin [R79.89] Essential hypertension [I10] Chest pain, unspecified type [R07.9]  DISCHARGE DIAGNOSIS:  Active Problems:   Hypertensive urgency   SECONDARY DIAGNOSIS:   Past Medical History:  Diagnosis Date  . Hypertension   . Patient denies medical problems     HOSPITAL COURSE:   68 year old female with past medical history of essential hypertension who presented to the hospital due to chest pain/pressure noted to have accelerated hypertension.  1.  Hypertensive urgency/accelerated hypertension- secondary to underlying anxiety/stress. -Patient presented to the hospital with systolic blood pressures greater than 200.  Patient was placed on some IV hydralazine, her lisinopril dose was increased and she was started on some low-dose Norvasc. - Her blood pressures have improved.  She will be discharged home on the higher dose lisinopril and low-dose Norvasc with outpatient follow-up with the PCP.  2.  Chest pain/elevated troponin-this is secondary to supply demand ischemia from patient's accelerated hypertension/hypertensive urgency. -Troponins did not trend upwards.  She is clinically asymptomatic now therefore being discharged home.  DISCHARGE CONDITIONS:   Stable  CONSULTS OBTAINED:    DRUG ALLERGIES:  No Known Allergies  DISCHARGE MEDICATIONS:   Allergies as of 02/09/2019   No Known Allergies     Medication List    TAKE these medications   amLODipine 5 MG tablet Commonly known as: NORVASC Take 1 tablet (5 mg total) by mouth daily.   lisinopril 20 MG tablet Commonly known as: ZESTRIL Take 1 tablet (20 mg total) by mouth daily. What changed:    medication strength  how much to take   tretinoin 0.025 % cream Commonly known as: RETIN-A Apply 1 application topically at bedtime.         DISCHARGE INSTRUCTIONS:   DIET:  Cardiac diet  DISCHARGE CONDITION:  Stable  ACTIVITY:  Activity as tolerated  OXYGEN:  Home Oxygen: No.   Oxygen Delivery: room air  DISCHARGE LOCATION:  home   If you experience worsening of your admission symptoms, develop shortness of breath, life threatening emergency, suicidal or homicidal thoughts you must seek medical attention immediately by calling 911 or calling your MD immediately  if symptoms less severe.  You Must read complete instructions/literature along with all the possible adverse reactions/side effects for all the Medicines you take and that have been prescribed to you. Take any new Medicines after you have completely understood and accpet all the possible adverse reactions/side effects.   Please note  You were cared for by a hospitalist during your hospital stay. If you have any questions about your discharge medications or the care you received while you were in the hospital after you are discharged, you can call the unit and asked to speak with the hospitalist on call if the hospitalist that took care of you is not available. Once you are discharged, your primary care physician will handle any further medical issues. Please note that NO REFILLS for any discharge medications will be authorized once you are discharged, as it is imperative that you return to your primary care physician (or establish a relationship with a primary care physician if you do not have one) for your aftercare needs so that they can  reassess your need for medications and monitor your lab values.     Today   Chest pain has resolved now, no complaints presently.  Troponins have remained flat.  Blood pressure improved.  Will discharge home today.  VITAL SIGNS:  Blood pressure (!) 134/49, pulse 63,  temperature (!) 97.5 F (36.4 C), temperature source Oral, resp. rate 15, height 5\' 4"  (1.626 m), weight 77.6 kg, SpO2 98 %.  I/O:  No intake or output data in the 24 hours ending 02/09/19 1557  PHYSICAL EXAMINATION:  GENERAL:  68 y.o.-year-old patient lying in the bed with no acute distress.  EYES: Pupils equal, round, reactive to light and accommodation. No scleral icterus. Extraocular muscles intact.  HEENT: Head atraumatic, normocephalic. Oropharynx and nasopharynx clear.  NECK:  Supple, no jugular venous distention. No thyroid enlargement, no tenderness.  LUNGS: Normal breath sounds bilaterally, no wheezing, rales,rhonchi. No use of accessory muscles of respiration.  CARDIOVASCULAR: S1, S2 normal. No murmurs, rubs, or gallops.  ABDOMEN: Soft, non-tender, non-distended. Bowel sounds present. No organomegaly or mass.  EXTREMITIES: No pedal edema, cyanosis, or clubbing.  NEUROLOGIC: Cranial nerves II through XII are intact. No focal motor or sensory defecits b/l.  PSYCHIATRIC: The patient is alert and oriented x 3. Good affect.  SKIN: No obvious rash, lesion, or ulcer.   DATA REVIEW:   CBC Recent Labs  Lab 02/09/19 0013  WBC 7.7  HGB 13.8  HCT 41.3  PLT 240    Chemistries  Recent Labs  Lab 02/09/19 0013  NA 139  K 3.4*  CL 105  CO2 27  GLUCOSE 134*  BUN 12  CREATININE 0.60  CALCIUM 9.1  AST 24  ALT 21  ALKPHOS 84  BILITOT 0.3    Cardiac Enzymes No results for input(s): TROPONINI in the last 168 hours.  Microbiology Results  Results for orders placed or performed during the hospital encounter of 02/09/19  SARS Coronavirus 2 [facility](Hospital order, Performed in [facility] hospital lab) Nasopharyngeal Nasopharyngeal Swab     Status: None   Collection Time: 02/09/19  5:08 AM   Specimen: Nasopharyngeal Swab  Result Value Ref Range Status   SARS Coronavirus 2 NEGATIVE NEGATIVE Final    Comment: (NOTE) If result is NEGATIVE SARS-CoV-2 target nucleic acids are NOT  DETECTED. The SARS-CoV-2 RNA is generally detectable in upper and lower  respiratory specimens during the acute phase of infection. The lowest  concentration of SARS-CoV-2 viral copies this assay can detect is 250  copies / mL. A negative result does not preclude SARS-CoV-2 infection  and should not be used as the sole basis for treatment or other  patient management decisions.  A negative result may occur with  improper specimen collection / handling, submission of specimen other  than nasopharyngeal swab, presence of viral mutation(s) within the  areas targeted by this assay, and inadequate number of viral copies  (<250 copies / mL). A negative result must be combined with clinical  observations, patient history, and epidemiological information. If result is POSITIVE SARS-CoV-2 target nucleic acids are DETECTED. The SARS-CoV-2 RNA is generally detectable in upper and lower  respiratory specimens dur ing the acute phase of infection.  Positive  results are indicative of active infection with SARS-CoV-2.  Clinical  correlation with patient history and other diagnostic information is  necessary to determine patient infection status.  Positive results do  not rule out bacterial infection or co-infection with other viruses. If result is PRESUMPTIVE POSTIVE SARS-CoV-2 nucleic acids MAY BE  PRESENT.   A presumptive positive result was obtained on the submitted specimen  and confirmed on repeat testing.  While 2019 novel coronavirus  (SARS-CoV-2) nucleic acids may be present in the submitted sample  additional confirmatory testing may be necessary for epidemiological  and / or clinical management purposes  to differentiate between  SARS-CoV-2 and other Sarbecovirus currently known to infect humans.  If clinically indicated additional testing with an alternate test  methodology [phone]) is advised. The SARS-CoV-2 RNA is generally  detectable in upper and lower respiratory sp ecimens during  the acute  phase of infection. The expected result is Negative. Fact Sheet for Patients:  StrictlyIdeas.no Fact Sheet for Healthcare Providers: BankingDealers.co.za This test is not yet approved or cleared by the [place] FDA and has been authorized for detection and/or diagnosis of SARS-CoV-2 by FDA under an Emergency Use Authorization (EUA).  This EUA will remain in effect (meaning this test can be used) for the duration of the COVID-19 declaration under Section 564(b)(1) of the Act, 21 U.S.C. section 360bbb-3(b)(1), unless the authorization is terminated or revoked sooner. Performed at [facility], [address] [place], [place] [address]     RADIOLOGY:  Dg Chest 2 View  Result Date: 02/09/2019 CLINICAL DATA:  Hypertension EXAM: CHEST - 2 VIEW COMPARISON:  None. FINDINGS: Lungs are clear.  No pleural effusion or pneumothorax. The heart is normal in size. Visualized osseous structures are within normal limits. IMPRESSION: Normal chest radiographs. Electronically Signed   By: Julian Hy M.D.   On: 02/09/2019 03:23      Management plans discussed with the patient, family and they are in agreement.  CODE STATUS:     Code Status Orders  (From admission, onward)         Start     Ordered   02/09/19 0855  Full code  Continuous     02/09/19 0854        Code Status History    This patient has a current code status but no historical code status.   Advance Care Planning Activity      TOTAL TIME TAKING CARE OF THIS PATIENT: 40 minutes.    Henreitta Leber M.D on 02/09/2019 at 3:57 PM  Between 7am to 6pm - Pager - [phone]  After 6pm go to [email or web address] - Proofreader  [facility] [place] [facility]  Office  [phone]  CC: Primary care physician; Sofie Hartigan, MD

## 2019-02-09 NOTE — ED Notes (Signed)
Patient transported to X-ray 

## 2019-02-09 NOTE — Progress Notes (Signed)
Went over discharge instructions with the patient including medications and follow-up appointment. Discontinue PIV and telemetry monitor. Husband is here for her ride.

## 2019-02-09 NOTE — H&P (Signed)
Rhonda Rogers is an 68 y.o. female.   Chief Complaint: Chest tightness HPI: The patient with past medical history of hypertension presents to the emergency department due to feeling unwell.  The patient reports that she "felt off" all day.  She had been checking her blood pressure throughout the day and it kept climbing.  The patient attempted to rest, hoping that her pressure would improve when she went to bed.  It did not and she drove herself to the emergency department.  At that time she admits to feeling some tightness in her chest but denies shortness of breath, nausea, vomiting or diaphoresis.  Upon arrival patient's blood pressure was 257/79.  She was given clonidine 0.05 milligrams prior to the emergency department staff called the hospitalist service for admission.  Past Medical History:  Diagnosis Date  . Hypertension   . Patient denies medical problems     Past Surgical History:  Procedure Laterality Date  . ABDOMINAL HYSTERECTOMY      Family History  Problem Relation Age of Onset  . Hypertension Mother   . CAD Father    Social History:  reports that she has quit smoking. She has never used smokeless tobacco. She reports current alcohol use. She reports that she does not use drugs.  Allergies: No Known Allergies  Prior to Admission medications   Medication Sig Start Date End Date Taking? Authorizing Provider  lisinopril (ZESTRIL) 10 MG tablet Take 10 mg by mouth daily. 01/20/19 01/20/20 Yes [provider]  tretinoin (RETIN-A) 0.025 % cream Apply 1 application topically at bedtime.    Yes [provider]     Results for orders placed or performed during the hospital encounter of 02/09/19 (from the past 48 hour(s))  CBC with Differential     Status: None   Collection Time: 02/09/19 12:13 AM  Result Value Ref Range   WBC 7.7 4.0 - 10.5 K/uL   RBC 4.50 3.87 - 5.11 MIL/uL   Hemoglobin 13.8 12.0 - 15.0 g/dL   HCT 41.3 36.0 - 46.0 %   MCV 91.8 80.0 - 100.0  fL   MCH 30.7 26.0 - 34.0 pg   MCHC 33.4 30.0 - 36.0 g/dL   RDW 12.5 11.5 - 15.5 %   Platelets 240 150 - 400 K/uL   nRBC 0.0 0.0 - 0.2 %   Neutrophils Relative % 56 %   Neutro Abs 4.3 1.7 - 7.7 K/uL   Lymphocytes Relative 34 %   Lymphs Abs 2.6 0.7 - 4.0 K/uL   Monocytes Relative 7 %   Monocytes Absolute 0.5 0.1 - 1.0 K/uL   Eosinophils Relative 3 %   Eosinophils Absolute 0.3 0.0 - 0.5 K/uL   Basophils Relative 0 %   Basophils Absolute 0.0 0.0 - 0.1 K/uL   Immature Granulocytes 0 %   Abs Immature Granulocytes 0.03 0.00 - 0.07 K/uL    Comment: Performed at [facility], [address] [place], [place] [address]  Comprehensive metabolic panel     Status: Abnormal   Collection Time: 02/09/19 12:13 AM  Result Value Ref Range   Sodium 139 135 - 145 mmol/L   Potassium 3.4 (L) 3.5 - 5.1 mmol/L   Chloride 105 98 - 111 mmol/L   CO2 27 22 - 32 mmol/L   Glucose, Bld 134 (H) 70 - 99 mg/dL   BUN 12 8 - 23 mg/dL   Creatinine, Ser 0.60 0.44 - 1.00 mg/dL   Calcium 9.1 8.9 - 10.3 mg/dL  Total Protein 7.1 6.5 - 8.1 g/dL   Albumin 4.3 3.5 - 5.0 g/dL   AST 24 15 - 41 U/L   ALT 21 0 - 44 U/L   Alkaline Phosphatase 84 38 - 126 U/L   Total Bilirubin 0.3 0.3 - 1.2 mg/dL   GFR calc non Af Amer >60 >60 mL/min   GFR calc Af Amer >60 >60 mL/min   Anion gap 7 5 - 15    Comment: Performed at [facility], [address] [place], [place] [address]  Troponin I (High Sensitivity)     Status: None   Collection Time: 02/09/19 12:13 AM  Result Value Ref Range   Troponin I (High Sensitivity) 7 <18 ng/L    Comment: (NOTE) Elevated high sensitivity troponin I (hsTnI) values and significant  changes across serial measurements may suggest ACS but many other  chronic and acute conditions are known to elevate hsTnI results.  Refer to the "Links" section for chest pain algorithms and additional  guidance. Performed at [facility], [address] [place], [place] [address]    Troponin I (High Sensitivity)     Status: Abnormal   Collection Time: 02/09/19  3:20 AM  Result Value Ref Range   Troponin I (High Sensitivity) 18 (H) <18 ng/L    Comment: (NOTE) Elevated high sensitivity troponin I (hsTnI) values and significant  changes across serial measurements may suggest ACS but many other  chronic and acute conditions are known to elevate hsTnI results.  Refer to the "Links" section for chest pain algorithms and additional  guidance. Performed at [facility], [address] [place], [place] [address]   SARS Coronavirus 2 [facility](Hospital order, Performed in [facility] hospital lab) Nasopharyngeal Nasopharyngeal Swab     Status: None   Collection Time: 02/09/19  5:08 AM   Specimen: Nasopharyngeal Swab  Result Value Ref Range   SARS Coronavirus 2 NEGATIVE NEGATIVE    Comment: (NOTE) If result is NEGATIVE SARS-CoV-2 target nucleic acids are NOT DETECTED. The SARS-CoV-2 RNA is generally detectable in upper and lower  respiratory specimens during the acute phase of infection. The lowest  concentration of SARS-CoV-2 viral copies this assay can detect is 250  copies / mL. A negative result does not preclude SARS-CoV-2 infection  and should not be used as the sole basis for treatment or other  patient management decisions.  A negative result may occur with  improper specimen collection / handling, submission of specimen other  than nasopharyngeal swab, presence of viral mutation(s) within the  areas targeted by this assay, and inadequate number of viral copies  (<250 copies / mL). A negative result must be combined with clinical  observations, patient history, and epidemiological information. If result is POSITIVE SARS-CoV-2 target nucleic acids are DETECTED. The SARS-CoV-2 RNA is generally detectable in upper and lower  respiratory specimens dur ing the acute phase of infection.  Positive  results are indicative of active infection with SARS-CoV-2.   Clinical  correlation with patient history and other diagnostic information is  necessary to determine patient infection status.  Positive results do  not rule out bacterial infection or co-infection with other viruses. If result is PRESUMPTIVE POSTIVE SARS-CoV-2 nucleic acids MAY BE PRESENT.   A presumptive positive result was obtained on the submitted specimen  and confirmed on repeat testing.  While 2019 novel coronavirus  (SARS-CoV-2) nucleic acids may be present in the submitted sample  additional confirmatory testing may be necessary for epidemiological  and / or clinical management purposes  to differentiate between  SARS-CoV-2 and other Sarbecovirus currently known to infect humans.  If clinically indicated additional testing with an alternate test  methodology [phone]([id number]) is advised. The SARS-CoV-2 RNA is generally  detectable in upper and lower respiratory sp ecimens during the acute  phase of infection. The expected result is Negative. Fact Sheet for Patients:  BoilerBrush.com.cyhttps://www.fda.gov/media/136312/download Fact Sheet for Healthcare Providers: https://pope.com/https://www.fda.gov/media/136313/download This test is not yet approved or cleared by the [place] FDA and has been authorized for detection and/or diagnosis of SARS-CoV-2 by FDA under an Emergency Use Authorization (EUA).  This EUA will remain in effect (meaning this test can be used) for the duration of the COVID-19 declaration under Section 564(b)(1) of the Act, 21 U.S.C. section 360bbb-3(b)(1), unless the authorization is terminated or revoked sooner. Performed at [facility], [address] [place], [place] [address]   Urinalysis, Complete w Microscopic     Status: Abnormal   Collection Time: 02/09/19  5:10 AM  Result Value Ref Range   Color, Urine STRAW (A) YELLOW   APPearance HAZY (A) CLEAR   Specific Gravity, Urine 1.011 1.005 - 1.030   pH 5.0 5.0 - 8.0   Glucose, UA NEGATIVE NEGATIVE mg/dL   Hgb urine  dipstick SMALL (A) NEGATIVE   Bilirubin Urine NEGATIVE NEGATIVE   Ketones, ur NEGATIVE NEGATIVE mg/dL   Protein, ur NEGATIVE NEGATIVE mg/dL   Nitrite NEGATIVE NEGATIVE   Leukocytes,Ua SMALL (A) NEGATIVE   RBC / HPF 0-5 0 - 5 RBC/hpf   WBC, UA 6-10 0 - 5 WBC/hpf   Bacteria, UA NONE SEEN NONE SEEN   Squamous Epithelial / LPF 0-5 0 - 5   Mucus PRESENT     Comment: Performed at [facility], [address] [place], [place] [address]  Troponin I (High Sensitivity)     Status: Abnormal   Collection Time: 02/09/19  5:10 AM  Result Value Ref Range   Troponin I (High Sensitivity) 20 (H) <18 ng/L    Comment: (NOTE) Elevated high sensitivity troponin I (hsTnI) values and significant  changes across serial measurements may suggest ACS but many other  chronic and acute conditions are known to elevate hsTnI results.  Refer to the "Links" section for chest pain algorithms and additional  guidance. Performed at [facility], [address] [place], [place] [address]    Dg Chest 2 View  Result Date: 02/09/2019 CLINICAL DATA:  Hypertension EXAM: CHEST - 2 VIEW COMPARISON:  None. FINDINGS: Lungs are clear.  No pleural effusion or pneumothorax. The heart is normal in size. Visualized osseous structures are within normal limits. IMPRESSION: Normal chest radiographs. Electronically Signed   By: Charline BillsSriyesh  Krishnan M.D.   On: 02/09/2019 03:23    Review of Systems  Constitutional: Negative for chills and fever.  HENT: Negative for sore throat and tinnitus.   Eyes: Negative for blurred vision and redness.  Respiratory: Negative for cough and shortness of breath.   Cardiovascular: Positive for chest pain ("tightness"). Negative for palpitations, orthopnea and PND.  Gastrointestinal: Negative for abdominal pain, diarrhea, nausea and vomiting.  Genitourinary: Negative for dysuria, frequency and urgency.  Musculoskeletal: Negative for joint pain and myalgias.  Skin: Negative for rash.        No lesions  Neurological: Negative for speech change, focal weakness and weakness.  Endo/Heme/Allergies: Does not bruise/bleed easily.       No temperature intolerance  Psychiatric/Behavioral: Negative for depression and suicidal ideas.  Blood pressure (!) 162/55, pulse (!) 57, temperature 98.2 F (36.8 C), temperature source Oral, resp. rate 18, height 5\' 4"  (1.626 m), weight 77.1 kg, SpO2 100 %. Physical Exam  Vitals reviewed. Constitutional: She is oriented to person, place, and time. She appears well-developed and well-nourished. No distress.  HENT:  Head: Normocephalic and atraumatic.  Mouth/Throat: Oropharynx is clear and moist.  Eyes: Pupils are equal, round, and reactive to light. Conjunctivae and EOM are normal. No scleral icterus.  Neck: Normal range of motion. Neck supple. No JVD present. No tracheal deviation present. No thyromegaly present.  Cardiovascular: Normal rate, regular rhythm and normal heart sounds. Exam reveals no gallop and no friction rub.  No murmur heard. Respiratory: Effort normal and breath sounds normal.  GI: Soft. Bowel sounds are normal. She exhibits no distension. There is no abdominal tenderness.  Genitourinary:    Genitourinary Comments: Deferred   Musculoskeletal: Normal range of motion.        General: No edema.  Lymphadenopathy:    She has no cervical adenopathy.  Neurological: She is alert and oriented to person, place, and time. No cranial nerve deficit. She exhibits normal muscle tone.  Skin: Skin is warm and dry. No rash noted. No erythema.  Psychiatric: She has a normal mood and affect. Her behavior is normal. Judgment and thought content normal.     Assessment/Plan 68 year old female admitted for hypertensive urgency. 1.  Hypertensive urgency: Resolved. Her pressure has now normalized.  I have added amlodipine to her daily regimen.  Continue to monitor. IV hydralazine as needed. 2.  Elevated troponin: Likely secondary to demand  ischemia.  Continue to follow cardiac enzymes.  Consult cardiology. 3.  Hypokalemia: Replete potassium. 4.  DVT prophylaxis: Lovenox 5.  GI prophylaxis: None Patient is a full code.  Time spent on admission orders and patient care approximately 45 minutes  Arnaldo Nataliamond,  Evalena Fujii S, MD 02/09/2019, 6:39 AM

## 2019-02-09 NOTE — ED Notes (Signed)
Report to Stephanie RN.

## 2019-02-09 NOTE — ED Triage Notes (Signed)
Pt presents to ED with HTN for the past couple of days with no improvement with her prescribed medications. Pt reports that today she developed chest tightness while driving to ED. Pt states she is not sure if that may have been caused by her anxiety due to her blood pressure being elevated. Chest tightness has resolved at this time. Denies sob.

## 2020-04-23 IMAGING — CR CHEST - 2 VIEW
1 series · 2 of 2 positions shown · non-contrast
Comparison: None.

CLINICAL DATA: Hypertension

EXAM:
CHEST - 2 VIEW

[Series 1: dg chest 2 view · 0.14mm/px · 2 of 2 slices shown]
[im 1/2]
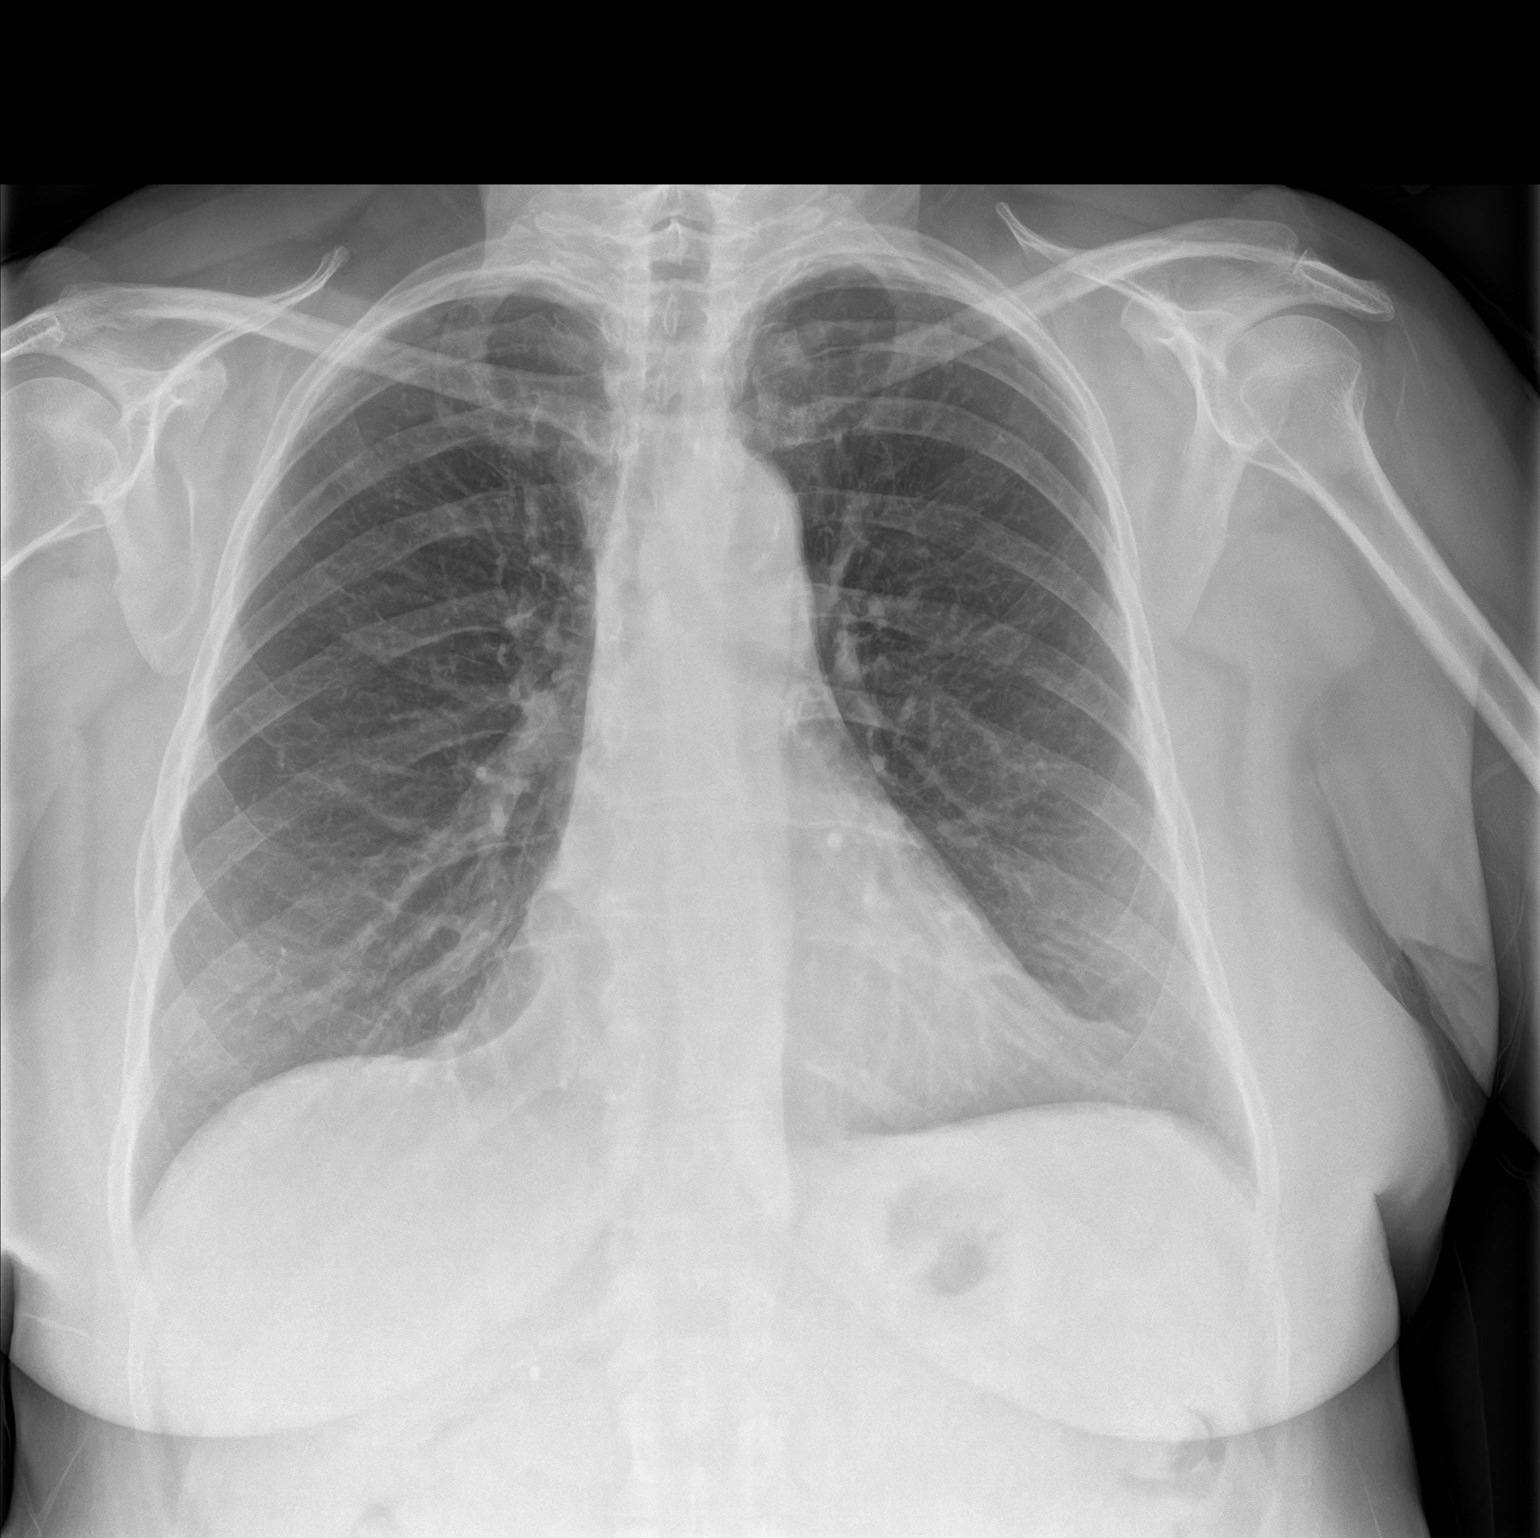
[im 2/2]
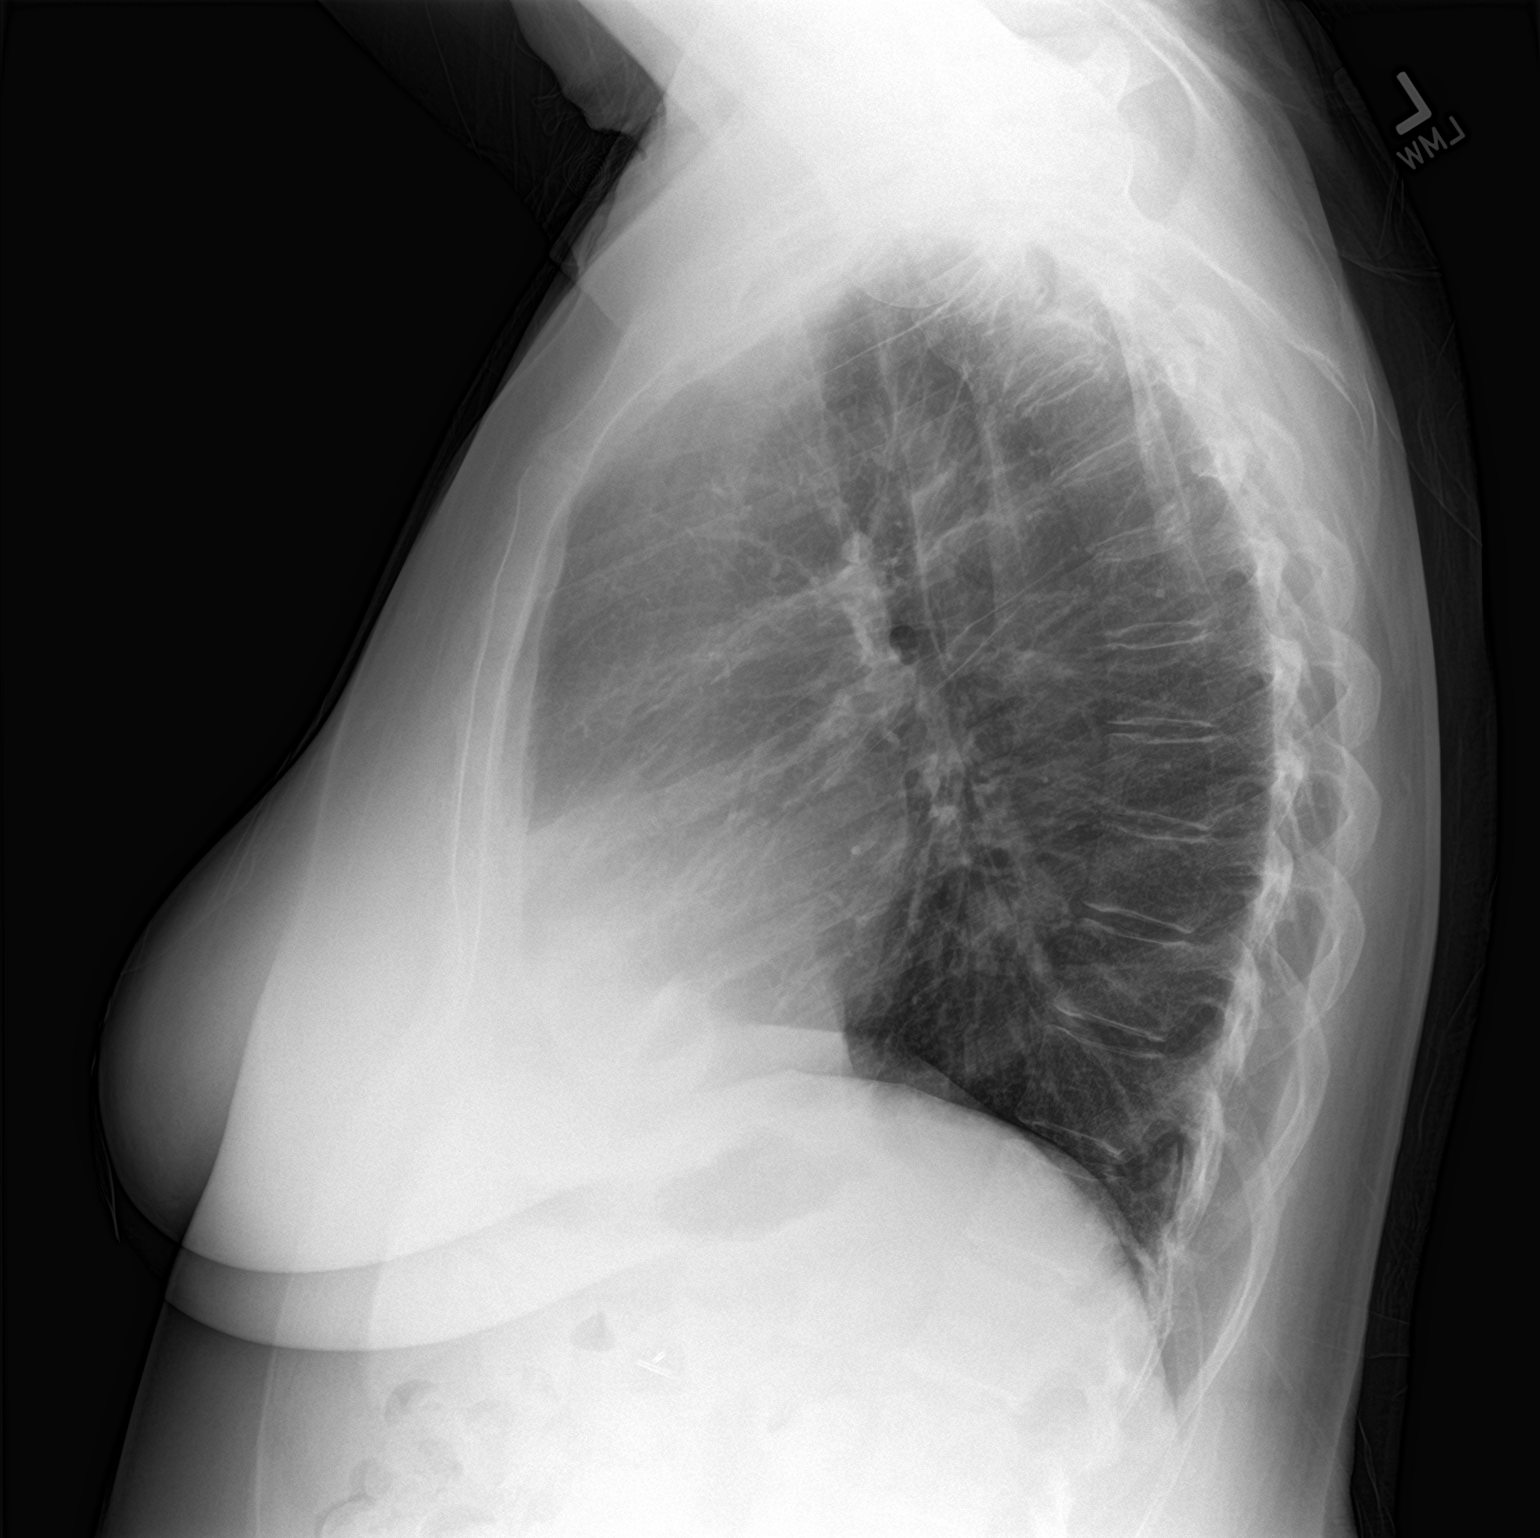

[2 of 2 positions shown; findings below may reference images not displayed]

FINDINGS: Lungs are clear.  No pleural effusion or pneumothorax.

The heart is normal in size.

Visualized osseous structures are within normal limits.
IMPRESSION: Normal chest radiographs.
